# Patient Record
Sex: Female | Born: 2000 | Race: White | Hispanic: No | Smoking: Never smoker
Health system: Southern US, Community
[De-identification: ages and names within clinical notes are randomized; demographics above are authoritative.]

## PROBLEM LIST (undated history)

## (undated) DIAGNOSIS — H6690 Otitis media, unspecified, unspecified ear: Secondary | ICD-10-CM

## (undated) DIAGNOSIS — E282 Polycystic ovarian syndrome: Secondary | ICD-10-CM

## (undated) DIAGNOSIS — F32A Depression, unspecified: Secondary | ICD-10-CM

## (undated) DIAGNOSIS — L659 Nonscarring hair loss, unspecified: Secondary | ICD-10-CM

## (undated) DIAGNOSIS — F329 Major depressive disorder, single episode, unspecified: Secondary | ICD-10-CM

## (undated) DIAGNOSIS — F909 Attention-deficit hyperactivity disorder, unspecified type: Secondary | ICD-10-CM

## (undated) HISTORY — DX: Major depressive disorder, single episode, unspecified: F32.9

## (undated) HISTORY — PX: OTHER SURGICAL HISTORY: SHX169

## (undated) HISTORY — DX: Polycystic ovarian syndrome: E28.2

## (undated) HISTORY — DX: Nonscarring hair loss, unspecified: L65.9

## (undated) HISTORY — DX: Attention-deficit hyperactivity disorder, unspecified type: F90.9

## (undated) HISTORY — DX: Otitis media, unspecified, unspecified ear: H66.90

## (undated) HISTORY — DX: Depression, unspecified: F32.A

## (undated) HISTORY — PX: TYMPANOSTOMY TUBE PLACEMENT: SHX32

---
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
## 2020-01-18 ENCOUNTER — Encounter: Payer: Self-pay | Admitting: Family Medicine

## 2020-01-24 ENCOUNTER — Encounter: Payer: Self-pay | Admitting: Family Medicine

## 2020-02-02 ENCOUNTER — Encounter: Payer: Self-pay | Admitting: Family Medicine

## 2020-02-02 ENCOUNTER — Telehealth (INDEPENDENT_AMBULATORY_CARE_PROVIDER_SITE_OTHER): Payer: Managed Care, Other (non HMO) | Admitting: Family Medicine

## 2020-02-02 DIAGNOSIS — F900 Attention-deficit hyperactivity disorder, predominantly inattentive type: Secondary | ICD-10-CM | POA: Diagnosis not present

## 2020-02-02 MED ORDER — AMPHETAMINE-DEXTROAMPHET ER 20 MG PO CP24: 20.0000 mg | ORAL_CAPSULE | Freq: Every day | ORAL | 0 refills | Status: DC

## 2020-02-02 NOTE — Progress Notes (Signed)
   Subjective:    Patient ID: Jackie Tran, female    DOB: 02/01/2001, 19 y.o.   MRN: [id number]  HPI Virtual Visit via Video Note  I connected with the patient on 02/02/20 at  1:15 PM EDT by a video enabled telemedicine application and verified that I am speaking with the correct person using two identifiers.  Location patient: home Location provider:work or home office Persons participating in the virtual visit: patient, provider  I discussed the limitations of evaluation and management by telemedicine and the availability of in person appointments. The patient expressed understanding and agreed to proceed.   HPI: Here asking to get back on Adderall. She took this for a few years in high school and it worked well for her. Now she will be going to college next month and she wants to try it again.    ROS: See pertinent positives and negatives per HPI.  Past Medical History:  Diagnosis Date  . ADHD (attention deficit hyperactivity disorder)   . Asthma   . Depression    sees Ellis Savage NP at [facility]   . Otitis media    frequent occurences as a chiild    Past Surgical History:  Procedure Laterality Date  . surgery to repiar a fractured left radial head     per Dr. Ollen Gross  . TYMPANOSTOMY TUBE PLACEMENT      Family History  Problem Relation Age of Onset  . Alcohol abuse Other   . Asthma Other   . Coronary artery disease Other   . Depression Other   . Diabetes Other   . Hypertension Other      Current Outpatient Medications:  .  busPIRone (BUSPAR) 10 MG tablet, TK 1 T PO QAM AND QPM, Disp: 180 tablet, Rfl: 3 .  drospirenone-ethinyl estradiol (YAZ) 3-0.02 MG tablet, TAKE 1 TABLET BY MOUTH DAILY, Disp: 84 tablet, Rfl: 0 .  sertraline (ZOLOFT) 100 MG tablet, TAKE 1 TABLET ONCE DAILY IN THE MORNING, Disp: 90 tablet, Rfl: 3  EXAM:  VITALS per patient if applicable:  GENERAL: alert, oriented, appears well and in no acute distress  HEENT:  atraumatic, conjunttiva clear, no obvious abnormalities on inspection of external nose and ears  NECK: normal movements of the head and neck  LUNGS: on inspection no signs of respiratory distress, breathing rate appears normal, no obvious gross SOB, gasping or wheezing  CV: no obvious cyanosis  MS: moves all visible extremities without noticeable abnormality  PSYCH/NEURO: pleasant and cooperative, no obvious depression or anxiety, speech and thought processing grossly intact  ASSESSMENT AND PLAN: ADHD, we wrote to restart Adderall XR 20 mg daily.  Gershon Crane, MD  Discussed the following assessment and plan:  No diagnosis found.     I discussed the assessment and treatment plan with the patient. The patient was provided an opportunity to ask questions and all were answered. The patient agreed with the plan and demonstrated an understanding of the instructions.   The patient was advised to call back or seek an in-person evaluation if the symptoms worsen or if the condition fails to improve as anticipated.     Review of Systems     Objective:   Physical Exam        Assessment & Plan:

## 2020-02-17 ENCOUNTER — Other Ambulatory Visit: Payer: Self-pay | Admitting: Family Medicine

## 2020-03-26 ENCOUNTER — Encounter: Payer: Self-pay | Admitting: Family Medicine

## 2020-03-28 MED ORDER — AMPHETAMINE-DEXTROAMPHET ER 20 MG PO CP24: 20.0000 mg | ORAL_CAPSULE | Freq: Every day | ORAL | 0 refills | Status: DC

## 2020-03-28 NOTE — Telephone Encounter (Signed)
rx on file at the pharmacy for 04/04/2020 [facility] to change pharmacy?

## 2020-03-28 NOTE — Telephone Encounter (Signed)
I sent them to the new pharmacy  °

## 2020-04-20 ENCOUNTER — Encounter: Payer: Self-pay | Admitting: Family Medicine

## 2020-04-21 MED ORDER — SERTRALINE HCL 100 MG PO TABS
ORAL_TABLET | ORAL | 3 refills | Status: DC
Start: 2020-04-21 — End: 2021-05-31

## 2020-04-21 NOTE — Addendum Note (Signed)
Addended by: Gershon Crane A on: 04/21/2020 04:42 PM   Modules accepted: Orders

## 2020-04-21 NOTE — Telephone Encounter (Signed)
I sent the corrected rx to her pharmacy

## 2020-04-21 NOTE — Telephone Encounter (Signed)
Patient called and advised that in her notes and medication profile it only has notations to take Sertraline 1 tab, asked when did she start taking 1.5 tabs. She says about 1.5 years ago when she was seeing the NP at the psychiatrist office, that it must not have transferred over to Dr. Clent Ridges. She says she has enough medication to last until Tuesday. I advised I will send the request to Dr. Clent Ridges for approval and the medication will be sent to [facility].

## 2020-09-29 ENCOUNTER — Encounter: Payer: Self-pay | Admitting: Family Medicine

## 2020-09-29 ENCOUNTER — Other Ambulatory Visit: Payer: Self-pay | Admitting: Family Medicine

## 2020-10-02 ENCOUNTER — Other Ambulatory Visit: Payer: Self-pay

## 2020-10-02 MED ORDER — BUSPIRONE HCL 10 MG PO TABS: ORAL_TABLET | ORAL | 0 refills | Status: DC

## 2021-01-23 ENCOUNTER — Ambulatory Visit: Payer: Managed Care, Other (non HMO)

## 2021-02-23 ENCOUNTER — Other Ambulatory Visit: Payer: Self-pay | Admitting: Family Medicine

## 2021-05-09 ENCOUNTER — Other Ambulatory Visit: Payer: Self-pay | Admitting: Family Medicine

## 2021-05-09 NOTE — Telephone Encounter (Signed)
Left a message for pt to call the office and schedule f/u OV visit / Video visit or her adderall refill

## 2021-05-16 ENCOUNTER — Other Ambulatory Visit: Payer: Self-pay | Admitting: Family Medicine

## 2021-05-16 NOTE — Telephone Encounter (Signed)
Pt has a MyChart Visit with Dr Clent Ridges on 05/22/2021 for Rx fefill

## 2021-05-22 ENCOUNTER — Telehealth: Payer: Self-pay | Admitting: Family Medicine

## 2021-05-29 ENCOUNTER — Telehealth: Payer: Self-pay | Admitting: Family Medicine

## 2021-05-31 ENCOUNTER — Telehealth (INDEPENDENT_AMBULATORY_CARE_PROVIDER_SITE_OTHER): Payer: Self-pay | Admitting: Family Medicine

## 2021-05-31 ENCOUNTER — Encounter: Payer: Self-pay | Admitting: Family Medicine

## 2021-05-31 DIAGNOSIS — F418 Other specified anxiety disorders: Secondary | ICD-10-CM

## 2021-05-31 DIAGNOSIS — F900 Attention-deficit hyperactivity disorder, predominantly inattentive type: Secondary | ICD-10-CM

## 2021-05-31 MED ORDER — AMPHETAMINE-DEXTROAMPHET ER 20 MG PO CP24: ORAL_CAPSULE | ORAL | 0 refills | Status: DC

## 2021-05-31 MED ORDER — SERTRALINE HCL 100 MG PO TABS: ORAL_TABLET | ORAL | 3 refills | Status: DC

## 2021-05-31 MED ORDER — BUSPIRONE HCL 10 MG PO TABS: 10.0000 mg | ORAL_TABLET | Freq: Two times a day (BID) | ORAL | 3 refills | Status: DC

## 2021-05-31 MED ORDER — LEVONORGEST-ETH ESTRAD 91-DAY 0.15-0.03 &0.01 MG PO TABS: 1.0000 | ORAL_TABLET | Freq: Every day | ORAL | 3 refills | Status: DC

## 2021-05-31 NOTE — Progress Notes (Signed)
Subjective:    Patient ID: Jackie Tran, female    DOB: 01-12-2001, 20 y.o.   MRN: [id number]  HPI Virtual Visit via Video Note  I connected with the patient on 05/31/21 at  1:45 PM EST by a video enabled telemedicine application and verified that I am speaking with the correct person using two identifiers.  Location patient: home Location provider:work or home office Persons participating in the virtual visit: patient, provider  I discussed the limitations of evaluation and management by telemedicine and the availability of in person appointments. The patient expressed understanding and agreed to proceed.   HPI: Here to follow up on ADHD and depression with anxiety. She is a Holiday representative at [employer], and she is doing well as far as her moods go. However her Adderall seems to wear off too soon and she often struggles with her focus in the afternoons. Her BP and weight have been stable.   ROS: See pertinent positives and negatives per HPI.  Past Medical History:  Diagnosis Date   ADHD (attention deficit hyperactivity disorder)    Asthma    Depression    sees Ellis Savage NP at [facility]    Otitis media    frequent occurences as a chiild    Past Surgical History:  Procedure Laterality Date   surgery to repiar a fractured left radial head     per Dr. Homero Fellers Aluisio   TYMPANOSTOMY TUBE PLACEMENT      Family History  Problem Relation Age of Onset   Alcohol abuse Other    Asthma Other    Coronary artery disease Other    Depression Other    Diabetes Other    Hypertension Other      Current Outpatient Medications:    Levonorgestrel-Ethinyl Estradiol (SIMPESSE) 0.15-0.03 &0.01 MG tablet, Take 1 tablet by mouth daily., Disp: 91 tablet, Rfl: 3   [START ON 07/31/2021] amphetamine-dextroamphetamine (ADDERALL XR) 20 MG 24 hr capsule, Take one in the morning and one in the afternoon, Disp: 60 capsule, Rfl: 0   busPIRone (BUSPAR) 10 MG tablet, Take 1  tablet (10 mg total) by mouth 2 (two) times daily. TK 1 T PO QAM AND QPM, Disp: 180 tablet, Rfl: 3   sertraline (ZOLOFT) 100 MG tablet, Take 1.5 tablets every day, Disp: 135 tablet, Rfl: 3  EXAM:  VITALS per patient if applicable:  GENERAL: alert, oriented, appears well and in no acute distress  HEENT: atraumatic, conjunttiva clear, no obvious abnormalities on inspection of external nose and ears  NECK: normal movements of the head and neck  LUNGS: on inspection no signs of respiratory distress, breathing rate appears normal, no obvious gross SOB, gasping or wheezing  CV: no obvious cyanosis  MS: moves all visible extremities without noticeable abnormality  PSYCH/NEURO: pleasant and cooperative, no obvious depression or anxiety, speech and thought processing grossly intact  ASSESSMENT AND PLAN: Her depression with anxiety is stable so we refilled the Sertraline and Buspirone as before. For the ADHD, we will  increase the Adderall XR 20 mg to one in the mornings and another dose in the early afternoons. Gershon Crane, MD  Discussed the following assessment and plan:  Attention deficit hyperactivity disorder (ADHD), predominantly inattentive type  Depression with anxiety     I discussed the assessment and treatment plan with the patient. The patient was provided an opportunity to ask questions and all were answered. The patient agreed with the plan and demonstrated an understanding of the instructions.  The patient was advised to call back or seek an in-person evaluation if the symptoms worsen or if the condition fails to improve as anticipated.      Review of Systems     Objective:   Physical Exam        Assessment & Plan:

## 2021-07-31 ENCOUNTER — Ambulatory Visit (INDEPENDENT_AMBULATORY_CARE_PROVIDER_SITE_OTHER): Payer: No Typology Code available for payment source | Admitting: Family Medicine

## 2021-07-31 ENCOUNTER — Encounter: Payer: Self-pay | Admitting: Family Medicine

## 2021-07-31 VITALS — BP 128/94 | HR 113 | Temp 98.5°F | Ht 63.0 in | Wt 236.4 lb

## 2021-07-31 DIAGNOSIS — F3281 Premenstrual dysphoric disorder: Secondary | ICD-10-CM | POA: Diagnosis not present

## 2021-07-31 DIAGNOSIS — F418 Other specified anxiety disorders: Secondary | ICD-10-CM

## 2021-07-31 DIAGNOSIS — F900 Attention-deficit hyperactivity disorder, predominantly inattentive type: Secondary | ICD-10-CM

## 2021-07-31 DIAGNOSIS — J3089 Other allergic rhinitis: Secondary | ICD-10-CM | POA: Insufficient documentation

## 2021-07-31 DIAGNOSIS — R739 Hyperglycemia, unspecified: Secondary | ICD-10-CM

## 2021-07-31 MED ORDER — VENLAFAXINE HCL ER 150 MG PO CP24: 150.0000 mg | ORAL_CAPSULE | Freq: Every day | ORAL | 3 refills | Status: DC

## 2021-07-31 NOTE — Progress Notes (Signed)
° °  Subjective:    Patient ID: Jackie Tran, female    DOB: [birth year], 21 y.o.   MRN: [id number]  HPI Here for several issues. She is here on winter break from her classes at [employer]. She has been dealing with more anxiety than ever before, and often this is tied her menstrual cycles. 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 She changed her BCP from a 28 day cycle to a 90 day cycle and she takes this continuously. She has been taking Buspar and Zoloft from [place] for several years. He sleeps well. Her appetite is good. Also she is concerned that she might have a weak immune system because she gets sick frequently. Over the past 5 months she has had 5 upper respiratory infections.    Review of Systems  Constitutional: Negative.   Respiratory: Negative.    Cardiovascular: Negative.   Psychiatric/Behavioral:  Negative for agitation, behavioral problems, confusion, decreased concentration, dysphoric mood, hallucinations, self-injury, sleep disturbance and suicidal ideas. The patient is nervous/anxious.       Objective:   Physical Exam Constitutional:      Appearance: Normal appearance.  Cardiovascular:     Rate and Rhythm: Normal rate and regular rhythm.     Pulses: Normal pulses.     Heart sounds: Normal heart sounds.  Pulmonary:     Effort: Pulmonary effort is normal.     Breath sounds: Normal breath sounds.  Neurological:     Mental Status: She is alert.  Psychiatric:        Mood and Affect: Mood normal.        Behavior: Behavior normal.        Thought Content: Thought content normal.          Assessment & Plan:  She will follow up with her GYN for PMDD. For he anxiety, she will stop both the Zoloft and the Buspar, and she will start Effexor XR 150 mg daily. I asked her to report back in 3-4 weeks. Because of her frequent URI's I asked her to use allergy medications every day. She will start taking Claritin and  Flonase daily. While she is here today we will get screening lab work to rule out other potential problems. We spent a total of ( 33  ) minutes reviewing records and discussing these issues.  Gershon Crane, MD

## 2021-08-01 LAB — CBC WITH DIFFERENTIAL/PLATELET
Basophils Absolute: 0.1 10*3/uL (ref 0.0–0.1)
Basophils Relative: 0.7 % (ref 0.0–3.0)
Eosinophils Absolute: 0.2 10*3/uL (ref 0.0–0.7)
Eosinophils Relative: 1.7 % (ref 0.0–5.0)
HCT: 42 % (ref 36.0–46.0)
Hemoglobin: 13.6 g/dL (ref 12.0–15.0)
Lymphocytes Relative: 23.9 % (ref 12.0–46.0)
Lymphs Abs: 2.5 10*3/uL (ref 0.7–4.0)
MCHC: 32.3 g/dL (ref 30.0–36.0)
MCV: 84.8 fl (ref 78.0–100.0)
Monocytes Absolute: 0.6 10*3/uL (ref 0.1–1.0)
Monocytes Relative: 6.1 % (ref 3.0–12.0)
Neutro Abs: 7.2 10*3/uL (ref 1.4–7.7)
Neutrophils Relative %: 67.6 % (ref 43.0–77.0)
Platelets: 322 10*3/uL (ref 150.0–400.0)
RBC: 4.95 Mil/uL (ref 3.87–5.11)
RDW: 14.1 % (ref 11.5–15.5)
WBC: 10.7 10*3/uL — ABNORMAL HIGH (ref 4.0–10.5)

## 2021-08-01 LAB — BASIC METABOLIC PANEL
BUN: 10 mg/dL (ref 6–23)
CO2: 24 mEq/L (ref 19–32)
Calcium: 9.1 mg/dL (ref 8.4–10.5)
Chloride: 105 mEq/L (ref 96–112)
Creatinine, Ser: 0.75 mg/dL (ref 0.40–1.20)
GFR: 114.13 mL/min (ref >60.00)
Glucose, Bld: 81 mg/dL (ref 70–99)
Potassium: 3.8 mEq/L (ref 3.5–5.1)
Sodium: 137 mEq/L (ref 135–145)

## 2021-08-01 LAB — HEPATIC FUNCTION PANEL
ALT: 36 U/L — ABNORMAL HIGH (ref 0–35)
AST: 25 U/L (ref 0–37)
Albumin: 4.2 g/dL (ref 3.5–5.2)
Alkaline Phosphatase: 84 U/L (ref 39–117)
Bilirubin, Direct: 0.1 mg/dL (ref 0.0–0.3)
Total Bilirubin: 0.3 mg/dL (ref 0.2–1.2)
Total Protein: 7.4 g/dL (ref 6.0–8.3)

## 2021-08-01 LAB — T4, FREE: Free T4: 1.07 ng/dL (ref 0.60–1.60)

## 2021-08-01 LAB — T3, FREE: T3, Free: 4.2 pg/mL (ref 2.3–4.2)

## 2021-08-01 LAB — TSH: TSH: 1.95 u[IU]/mL (ref 0.35–5.50)

## 2021-08-01 LAB — HEMOGLOBIN A1C: Hgb A1c MFr Bld: 5.5 % (ref 4.6–6.5)

## 2021-09-24 ENCOUNTER — Encounter: Payer: Self-pay | Admitting: Family Medicine

## 2021-09-24 NOTE — Telephone Encounter (Signed)
Please set up a virtual OV as soon as possible (maybe tomorrow?)  ?

## 2021-09-26 ENCOUNTER — Encounter: Payer: Self-pay | Admitting: Family Medicine

## 2021-09-26 ENCOUNTER — Telehealth (INDEPENDENT_AMBULATORY_CARE_PROVIDER_SITE_OTHER): Payer: No Typology Code available for payment source | Admitting: Family Medicine

## 2021-09-26 DIAGNOSIS — F418 Other specified anxiety disorders: Secondary | ICD-10-CM | POA: Diagnosis not present

## 2021-09-26 DIAGNOSIS — F900 Attention-deficit hyperactivity disorder, predominantly inattentive type: Secondary | ICD-10-CM

## 2021-09-26 DIAGNOSIS — J3089 Other allergic rhinitis: Secondary | ICD-10-CM | POA: Diagnosis not present

## 2021-09-26 MED ORDER — CLONAZEPAM 0.5 MG PO TABS: 0.5000 mg | ORAL_TABLET | Freq: Two times a day (BID) | ORAL | 5 refills | Status: DC | PRN

## 2021-09-26 MED ORDER — VENLAFAXINE HCL ER 150 MG PO CP24: 300.0000 mg | ORAL_CAPSULE | Freq: Every day | ORAL | 5 refills | Status: DC

## 2021-09-26 NOTE — Progress Notes (Signed)
? ?Subjective:  ? ? Patient ID: Jackie Tran, female    DOB: [birth year], 21 y.o.   MRN: [id number] ? ?HPI ?Virtual Visit via Video Note ? ?I connected with the patient on 09/26/21 at  2:00 PM EST by a video enabled telemedicine application and verified that I am speaking with the correct person using two identifiers. ? Location patient: home ?Location provider:work or home office ?Persons participating in the virtual visit: patient, provider ? ?I discussed the limitations of evaluation and management by telemedicine and the availability of in person appointments. The patient expressed understanding and agreed to proceed. ? ? ?HPI: ?Here to follow up on anxiety and allergies. She says her classes in [place] are going well. She has been taking Effexor XR 150 mg daily and she tolerates this well, however she doesn't think it is helping much. This past weekend she says the anxiety flared up and she was miserable for several days. She notes that she took Clonazepam for a few years while in high school, and this was very effective for her. At our last visit we also discussed her tendency to get frequent colds and URI's, and we felt her allergies may make her more susceptible to these. She has been using Xyzal and Flonase every day, but this has not helped much.  ? ? ?ROS: See pertinent positives and negatives per HPI. ? ?Past Medical History:  ?Diagnosis Date  ? ADHD (attention deficit hyperactivity disorder)   ? Asthma   ? Depression   ? sees Noemi Chapel NP at [facility]   ? Otitis media   ? frequent occurences as a chiild  ? ? ?Past Surgical History:  ?Procedure Laterality Date  ? surgery to repiar a fractured left radial head    ? per Dr. Gaynelle Arabian  ? TYMPANOSTOMY TUBE PLACEMENT    ? ? ?Family History  ?Problem Relation Age of Onset  ? Alcohol abuse Other   ? Asthma Other   ? Coronary artery disease Other   ? Depression Other   ? Diabetes Other   ? Hypertension Other   ? ? ? ?Current Outpatient Medications:   ?  amphetamine-dextroamphetamine (ADDERALL XR) 20 MG 24 hr capsule, Take one in the morning and one in the afternoon, Disp: 60 capsule, Rfl: 0 ?  Levonorgestrel-Ethinyl Estradiol (SIMPESSE) 0.15-0.03 &0.01 MG tablet, Take 1 tablet by mouth daily., Disp: 91 tablet, Rfl: 3 ?  venlafaxine XR (EFFEXOR XR) 150 MG 24 hr capsule, Take 1 capsule (150 mg total) by mouth daily with breakfast., Disp: 90 capsule, Rfl: 3 ? ?EXAM: ? ?VITALS per patient if applicable: ? ?GENERAL: alert, oriented, appears well and in no acute distress ? ?HEENT: atraumatic, conjunttiva clear, no obvious abnormalities on inspection of external nose and ears ? ?NECK: normal movements of the head and neck ? ?LUNGS: on inspection no signs of respiratory distress, breathing rate appears normal, no obvious gross SOB, gasping or wheezing ? ?CV: no obvious cyanosis ? ?MS: moves all visible extremities without noticeable abnormality ? ?PSYCH/NEURO: pleasant and cooperative, no obvious depression or anxiety, speech and thought processing grossly intact ? ?ASSESSMENT AND PLAN: ?For the anxiety, we will increase the Efffexor XR to 300 mg daily, and we will add Clonazepam 0.5 mg to take as needed for breakthrough anxiety. For the allergies, we will refer her to [facility] to do allergy testing when she comes home this summer.  ?Alysia Penna, MD ? ?Discussed the following assessment and plan: ? ?No  diagnosis found. ? ? ?  ?I discussed the assessment and treatment plan with the patient. The patient was provided an opportunity to ask questions and all were answered. The patient agreed with the plan and demonstrated an understanding of the instructions. ?  ?The patient was advised to call back or seek an in-person evaluation if the symptoms worsen or if the condition fails to improve as anticipated. ? ?  ? ? ?Review of Systems ? ?   ?Objective:  ? Physical Exam ? ? ? ? ?   ?Assessment & Plan:  ? ? ?

## 2021-10-04 ENCOUNTER — Telehealth: Payer: No Typology Code available for payment source | Admitting: Family Medicine

## 2021-11-13 ENCOUNTER — Encounter: Payer: Self-pay | Admitting: Family Medicine

## 2021-11-13 ENCOUNTER — Telehealth (INDEPENDENT_AMBULATORY_CARE_PROVIDER_SITE_OTHER): Payer: No Typology Code available for payment source | Admitting: Family Medicine

## 2021-11-13 DIAGNOSIS — R030 Elevated blood-pressure reading, without diagnosis of hypertension: Secondary | ICD-10-CM | POA: Diagnosis not present

## 2021-11-13 MED ORDER — METOPROLOL SUCCINATE ER 25 MG PO TB24: 25.0000 mg | ORAL_TABLET | Freq: Every day | ORAL | 3 refills | Status: DC

## 2021-11-13 NOTE — Progress Notes (Signed)
? ?Subjective:  ? ? Patient ID: Jackie Tran, female    DOB: 2001/02/07, 21 y.o.   MRN: [id number] ? ?HPI ?Virtual Visit via Video Note ? ?I connected with the patient on 11/13/21 at  4:00 PM EDT by a video enabled telemedicine application and verified that I am speaking with the correct person using two identifiers. ? Location patient: home ?Location provider:work or home office ?Persons participating in the virtual visit: patient, provider ? ?I discussed the limitations of evaluation and management by telemedicine and the availability of in person appointments. The patient expressed understanding and agreed to proceed. ? ? ?HPI: ?Here for concerns about high BP. The last time she was seen here was in January and her BP was 128/94 and pulse was 113. Since then she has been told at several doctor offices or urgent care clinics that her BP is high. In fact when she saw her GYN for a routine exam last week, her BP was 162/100. Her GYN changed her BCP that day to a progesterone only pill in case the estrogen was causing her BP to go up. Since then Shephanie has been checking herself daily in her dorm room. Her BP has ranged from 142-163 systolic and from 100 -105 diastolic. Her pulse rate has averaged from 86 -106. She says she has not taken any Adderall since January because she reserves this for around exams. She feels fine, and she denies any headaches or dizziness or SOB. Her weight has been stable. Of note she has a strong family hx of HTN including her mother.  ? ? ?ROS: See pertinent positives and negatives per HPI. ? ?Past Medical History:  ?Diagnosis Date  ? ADHD (attention deficit hyperactivity disorder)   ? Asthma   ? Depression   ? sees Ellis Savage NP at [facility]   ? Otitis media   ? frequent occurences as a chiild  ? ? ?Past Surgical History:  ?Procedure Laterality Date  ? surgery to repiar a fractured left radial head    ? per Dr. Ollen Gross  ? TYMPANOSTOMY TUBE PLACEMENT    ? ? ?Family  History  ?Problem Relation Age of Onset  ? Alcohol abuse Other   ? Asthma Other   ? Coronary artery disease Other   ? Depression Other   ? Diabetes Other   ? Hypertension Other   ? ? ? ?Current Outpatient Medications:  ?  amphetamine-dextroamphetamine (ADDERALL XR) 20 MG 24 hr capsule, Take one in the morning and one in the afternoon, Disp: 60 capsule, Rfl: 0 ?  clonazePAM (KLONOPIN) 0.5 MG tablet, Take 1 tablet (0.5 mg total) by mouth 2 (two) times daily as needed for anxiety., Disp: 60 tablet, Rfl: 5 ?  metoprolol succinate (TOPROL-XL) 25 MG 24 hr tablet, Take 1 tablet (25 mg total) by mouth daily., Disp: 30 tablet, Rfl: 3 ?  norethindrone (MICRONOR) 0.35 MG tablet, Take 1 tablet by mouth daily., Disp: , Rfl:  ?  venlafaxine XR (EFFEXOR-XR) 150 MG 24 hr capsule, Take 2 capsules (300 mg total) by mouth daily with breakfast., Disp: 60 capsule, Rfl: 5 ? ?EXAM: ? ?VITALS per patient if applicable: ? ?GENERAL: alert, oriented, appears well and in no acute distress ? ?HEENT: atraumatic, conjunttiva clear, no obvious abnormalities on inspection of external nose and ears ? ?NECK: normal movements of the head and neck ? ?LUNGS: on inspection no signs of respiratory distress, breathing rate appears normal, no obvious gross SOB, gasping or wheezing ? ?CV: no  obvious cyanosis ? ?MS: moves all visible extremities without noticeable abnormality ? ?PSYCH/NEURO: pleasant and cooperative, no obvious depression or anxiety, speech and thought processing grossly intact ? ?ASSESSMENT AND PLAN: ?She seems to have developed some HTN, so we will begin treating this with Metoprolol succinate 25 mg daily. She will be in [place] in the middle of May, and I asked her to set up an in person OV with me when she gets here. We will check labs then including  thyroid levels.  ?Gershon Crane, MD ? ?Discussed the following assessment and plan: ? ?No diagnosis found. ? ? ?  ?I discussed the assessment and treatment plan with the patient. The  patient was provided an opportunity to ask questions and all were answered. The patient agreed with the plan and demonstrated an understanding of the instructions. ?  ?The patient was advised to call back or seek an in-person evaluation if the symptoms worsen or if the condition fails to improve as anticipated. ? ?  ? ? ?Review of Systems ? ?   ?Objective:  ? Physical Exam ? ? ? ? ?   ?Assessment & Plan:  ? ? ?

## 2021-11-23 ENCOUNTER — Encounter: Payer: Self-pay | Admitting: Family Medicine

## 2021-11-23 NOTE — Progress Notes (Unsigned)
Let's try 50 mg daily. Tell her to take two 25 mg tablets a day for one week and then report back  ?

## 2021-12-03 ENCOUNTER — Encounter: Payer: Self-pay | Admitting: Family Medicine

## 2021-12-03 ENCOUNTER — Ambulatory Visit (INDEPENDENT_AMBULATORY_CARE_PROVIDER_SITE_OTHER): Payer: No Typology Code available for payment source | Admitting: Family Medicine

## 2021-12-03 VITALS — BP 122/80 | HR 68 | Temp 98.6°F | Wt 222.0 lb

## 2021-12-03 DIAGNOSIS — I10 Essential (primary) hypertension: Secondary | ICD-10-CM

## 2021-12-03 MED ORDER — METOPROLOL SUCCINATE ER 25 MG PO TB24: 25.0000 mg | ORAL_TABLET | Freq: Every day | ORAL | 3 refills | Status: DC

## 2021-12-03 NOTE — Progress Notes (Signed)
? ?  Subjective:  ? ? Patient ID: GIONNA ECKE, female    DOB: [birth year], 21 y.o.   MRN: [id number] ? ?HPI ?Here to follow up on HTN. When we had a video visit with her on 11-13-21 her BP was running as high as 162/100 with a rapid pulse rate. Since starting on Metoprolol succinate she has felt better, and her BP has been well controlled. She is out of class now for the summer. She plans to work at [employer] for 3 months.  ? ? ?Review of Systems  ?Constitutional: Negative.   ?Respiratory:  Positive for apnea.   ?Cardiovascular:  Negative for chest pain.  ? ?   ?Objective:  ? Physical Exam ?Constitutional:   ?   General: She is not in acute distress. ?   Appearance: She is obese.  ?Cardiovascular:  ?   Rate and Rhythm: Normal rate and regular rhythm.  ?   Pulses: Normal pulses.  ?Pulmonary:  ?   Effort: Pulmonary effort is normal.  ?   Breath sounds: Normal breath sounds.  ?Musculoskeletal:  ?   Right lower leg: No edema.  ?   Left lower leg: No edema.  ?Neurological:  ?   Mental Status: She is alert.  ? ? ? ? ? ?   ?Assessment & Plan:  ?HTN, now well controlled. She wil stay on the current regimen.  ?Alysia Penna, MD ? ? ?

## 2021-12-05 ENCOUNTER — Encounter: Payer: Self-pay | Admitting: Family Medicine

## 2021-12-07 NOTE — Telephone Encounter (Signed)
Patient called to follow up on Metoprolol being corrected to 50mg  from 25mg . I let patient know that PCP was out of office but I would send it back.       Please advise

## 2021-12-10 ENCOUNTER — Other Ambulatory Visit: Payer: Self-pay

## 2021-12-10 DIAGNOSIS — I10 Essential (primary) hypertension: Secondary | ICD-10-CM

## 2021-12-10 MED ORDER — METOPROLOL SUCCINATE ER 50 MG PO TB24: 50.0000 mg | ORAL_TABLET | Freq: Every day | ORAL | 3 refills | Status: DC

## 2021-12-10 NOTE — Telephone Encounter (Signed)
I agree. Please increase the Metoprolol succinate to 50 mg daily. Call in a one year supply

## 2021-12-17 NOTE — Progress Notes (Unsigned)
New Patient Note  RE: Jackie Tran MRN: [id number] DOB: [birth year] Date of Office Visit: 12/18/2021  Consult requested by: Nelwyn Salisbury, MD Primary care provider: Nelwyn Salisbury, MD  Chief Complaint: No chief complaint on file.  History of Present Illness: I had the pleasure of seeing Jackie Tran for initial evaluation at the [facility] on 12/17/2021. She is a 21 y.o. female, who is referred here by Nelwyn Salisbury, MD for the evaluation of environmental allergies.  She reports symptoms of ***. Symptoms have been going on for *** years. The symptoms are present *** all year around with worsening in ***. Other triggers include exposure to ***. Anosmia: ***. Headache: ***. She has used *** with ***fair improvement in symptoms. Sinus infections: ***. Previous work up includes: ***. Previous ENT evaluation: ***. Previous sinus imaging: ***. History of nasal polyps: ***. Last eye exam: ***. History of reflux: ***.  Assessment and Plan: Jackie Tran is a 21 y.o. female with: No problem-specific Assessment & Plan notes found for this encounter.  No follow-ups on file.  No orders of the defined types were placed in this encounter.  Lab Orders  No laboratory test(s) ordered today    Other allergy screening: Asthma: {Blank single:19197::"yes","no"} Rhino conjunctivitis: {Blank single:19197::"yes","no"} Food allergy: {Blank single:19197::"yes","no"} Medication allergy: {Blank single:19197::"yes","no"} Hymenoptera allergy: {Blank single:19197::"yes","no"} Urticaria: {Blank single:19197::"yes","no"} Eczema:{Blank single:19197::"yes","no"} History of recurrent infections suggestive of immunodeficency: {Blank single:19197::"yes","no"}  Diagnostics: Spirometry:  Tracings reviewed. Her effort: {Blank single:19197::"Good reproducible efforts.","It was hard to get consistent efforts and there is a question as to whether this reflects a maximal maneuver.","Poor  effort, data can not be interpreted."} FVC: ***L FEV1: ***L, ***% predicted FEV1/FVC ratio: ***% Interpretation: {Blank single:19197::"Spirometry consistent with mild obstructive disease","Spirometry consistent with moderate obstructive disease","Spirometry consistent with severe obstructive disease","Spirometry consistent with possible restrictive disease","Spirometry consistent with mixed obstructive and restrictive disease","Spirometry uninterpretable due to technique","Spirometry consistent with normal pattern","No overt abnormalities noted given today's efforts"}.  Please see scanned spirometry results for details.  Skin Testing: {Blank single:19197::"Select foods","Environmental allergy panel","Environmental allergy panel and select foods","Food allergy panel","None","Deferred due to recent antihistamines use"}. *** Results discussed with patient/family.   Past Medical History: Patient Active Problem List   Diagnosis Date Noted  . HTN (hypertension) 12/03/2021  . PMDD (premenstrual dysphoric disorder) 07/31/2021  . Environmental and seasonal allergies 07/31/2021  . Depression with anxiety 08/06/2019  . Dysmenorrhea 06/17/2018  . ADHD (attention deficit hyperactivity disorder) 10/29/2011   Past Medical History:  Diagnosis Date  . ADHD (attention deficit hyperactivity disorder)   . Asthma   . Depression    sees Jackie Savage NP at [facility]   . Otitis media    frequent occurences as a chiild   Past Surgical History: Past Surgical History:  Procedure Laterality Date  . surgery to repiar a fractured left radial head     per Dr. Ollen Gross  . TYMPANOSTOMY TUBE PLACEMENT     Medication List:  Current Outpatient Medications  Medication Sig Dispense Refill  . metoprolol succinate (TOPROL-XL) 50 MG 24 hr tablet Take 1 tablet (50 mg total) by mouth daily. Take with or immediately following a meal. 90 tablet 3  . amphetamine-dextroamphetamine (ADDERALL XR) 20 MG 24 hr  capsule Take one in the morning and one in the afternoon (Patient taking differently: as needed. Take one in the morning and one in the afternoon) 60 capsule 0  . clonazePAM (KLONOPIN) 0.5 MG tablet Take 1 tablet (0.5 mg total)  by mouth 2 (two) times daily as needed for anxiety. 60 tablet 5  . norethindrone (MICRONOR) 0.35 MG tablet Take 1 tablet by mouth daily.    Marland Kitchen venlafaxine XR (EFFEXOR-XR) 150 MG 24 hr capsule Take 2 capsules (300 mg total) by mouth daily with breakfast. 60 capsule 5   No current facility-administered medications for this visit.   Allergies: Allergies  Allergen Reactions  . Penicillins     REACTION: hives   Social History: Social History   Socioeconomic History  . Marital status: Single    Spouse name: Not on file  . Number of children: Not on file  . Years of education: Not on file  . Highest education level: Not on file  Occupational History  . Not on file  Tobacco Use  . Smoking status: Never  . Smokeless tobacco: Never  Substance and Sexual Activity  . Alcohol use: No    Alcohol/week: 0.0 standard drinks  . Drug use: No  . Sexual activity: Not on file  Other Topics Concern  . Not on file  Social History Narrative  . Not on file   Social Determinants of Health   Financial Resource Strain: Low Risk   . Difficulty of Paying Living Expenses: Not hard at all  Food Insecurity: No Food Insecurity  . Worried About Charity fundraiser in the Last Year: Never true  . Ran Out of Food in the Last Year: Never true  Transportation Needs: No Transportation Needs  . Lack of Transportation (Medical): No  . Lack of Transportation (Non-Medical): No  Physical Activity: Unknown  . Days of Exercise per Week: Patient refused  . Minutes of Exercise per Session: Not on file  Stress: Stress Concern Present  . Feeling of Stress : To some extent  Social Connections: Unknown  . Frequency of Communication with Friends and Family: Patient refused  . Frequency of  Social Gatherings with Friends and Family: Patient refused  . Attends Religious Services: Never  . Active Member of Clubs or Organizations: Patient refused  . Attends Archivist Meetings: Not on file  . Marital Status: Never married   Lives in a ***. Smoking: *** Occupation: ***  Environmental HistoryFreight forwarder in the house: Estate agent in the family room: {Blank single:19197::"yes","no"} Carpet in the bedroom: {Blank single:19197::"yes","no"} Heating: {Blank single:19197::"electric","gas","heat pump"} Cooling: {Blank single:19197::"central","window","heat pump"} Pet: {Blank single:19197::"yes ***","no"}  Family History: Family History  Problem Relation Age of Onset  . Alcohol abuse Other   . Asthma Other   . Coronary artery disease Other   . Depression Other   . Diabetes Other   . Hypertension Other    Problem                               Relation Asthma                                   *** Eczema                                *** Food allergy                          *** Allergic rhino conjunctivitis     ***  Review of Systems  Constitutional:  Negative for appetite change, chills, fever and unexpected weight change.  HENT:  Negative for congestion and rhinorrhea.   Eyes:  Negative for itching.  Respiratory:  Negative for cough, chest tightness, shortness of breath and wheezing.   Cardiovascular:  Negative for chest pain.  Gastrointestinal:  Negative for abdominal pain.  Genitourinary:  Negative for difficulty urinating.  Skin:  Negative for rash.  Neurological:  Negative for headaches.   Objective: There were no vitals taken for this visit. There is no height or weight on file to calculate BMI. Physical Exam Vitals and nursing note reviewed.  Constitutional:      Appearance: Normal appearance. She is well-developed.  HENT:     Head: Normocephalic and atraumatic.     Right Ear: Tympanic membrane and external  ear normal.     Left Ear: Tympanic membrane and external ear normal.     Nose: Nose normal.     Mouth/Throat:     Mouth: Mucous membranes are moist.     Pharynx: Oropharynx is clear.  Eyes:     Conjunctiva/sclera: Conjunctivae normal.  Cardiovascular:     Rate and Rhythm: Normal rate and regular rhythm.     Heart sounds: Normal heart sounds. No murmur heard.   No friction rub. No gallop.  Pulmonary:     Effort: Pulmonary effort is normal.     Breath sounds: Normal breath sounds. No wheezing, rhonchi or rales.  Musculoskeletal:     Cervical back: Neck supple.  Skin:    General: Skin is warm.     Findings: No rash.  Neurological:     Mental Status: She is alert and oriented to person, place, and time.  Psychiatric:        Behavior: Behavior normal.  The plan was reviewed with the patient/family, and all questions/concerned were addressed.  It was my pleasure to see Jackie Tran today and participate in her care. Please feel free to contact me with any questions or concerns.  Sincerely,  Rexene Alberts, DO Allergy & Immunology  [facility] office: [place] office: [phone]

## 2021-12-18 ENCOUNTER — Ambulatory Visit (INDEPENDENT_AMBULATORY_CARE_PROVIDER_SITE_OTHER): Payer: No Typology Code available for payment source | Admitting: Allergy

## 2021-12-18 ENCOUNTER — Encounter: Payer: Self-pay | Admitting: Allergy

## 2021-12-18 ENCOUNTER — Other Ambulatory Visit: Payer: Self-pay

## 2021-12-18 VITALS — BP 122/80 | HR 93 | Temp 98.7°F | Resp 18 | Ht 63.39 in | Wt 221.0 lb

## 2021-12-18 DIAGNOSIS — J3089 Other allergic rhinitis: Secondary | ICD-10-CM | POA: Diagnosis not present

## 2021-12-18 MED ORDER — RYALTRIS 665-25 MCG/ACT NA SUSP: 1.0000 | Freq: Two times a day (BID) | NASAL | 5 refills | Status: DC

## 2021-12-18 NOTE — Assessment & Plan Note (Signed)
Rhinitis symptoms for a few years mainly in the fall and spring.  Tried Xyzal and Flonase with minimal benefit.  No prior allergy/ENT evaluation.  Today's skin testing showed: Positive to grass, ragweed, weed, trees, dust mites, cockroach.  Start environmental control measures as below.  Use over the counter antihistamines such as Zyrtec (cetirizine), Claritin (loratadine), Allegra (fexofenadine), or Xyzal (levocetirizine) daily as needed. May take twice a day during allergy flares. May switch antihistamines every few months.  Start Ryaltris (olopatadine + mometasone nasal spray combination) 1-2 sprays per nostril twice a day. Sample given.  This will be mailed to you.  Stop Flonase.   Nasal saline spray (i.e., Simply Saline) or nasal saline lavage (i.e., NeilMed) is recommended as needed and prior to medicated nasal sprays.  Consider allergy injections for long term control if above medications do not help the symptoms - handout given.

## 2021-12-18 NOTE — Patient Instructions (Addendum)
Today's skin testing showed: Positive to grass, ragweed, weed, trees, dust mites, cockroach.  Results given.  Environmental allergies Start environmental control measures as below. Use over the counter antihistamines such as Zyrtec (cetirizine), Claritin (loratadine), Allegra (fexofenadine), or Xyzal (levocetirizine) daily as needed. May take twice a day during allergy flares. May switch antihistamines every few months. Start Ryaltris (olopatadine + mometasone nasal spray combination) 1-2 sprays per nostril twice a day. Sample given. This will be mailed to you. Stop Flonase.  Nasal saline spray (i.e., Simply Saline) or nasal saline lavage (i.e., NeilMed) is recommended as needed and prior to medicated nasal sprays. Consider allergy injections for long term control if above medications do not help the symptoms - handout given.   Follow up in 3 months or sooner if needed.    Reducing Pollen Exposure Pollen seasons: trees (spring), grass (summer) and ragweed/weeds (fall). Keep windows closed in your home and car to lower pollen exposure.  Install air conditioning in the bedroom and throughout the house if possible.  Avoid going out in dry windy days - especially early morning. Pollen counts are highest between 5 - 10 AM and on dry, hot and windy days.  Save outside activities for late afternoon or after a heavy rain, when pollen levels are lower.  Avoid mowing of grass if you have grass pollen allergy. Be aware that pollen can also be transported indoors on people and pets.  Dry your clothes in an automatic dryer rather than hanging them outside where they might collect pollen.  Rinse hair and eyes before bedtime. Control of House Dust Mite Allergen Dust mite allergens are a common trigger of allergy and asthma symptoms. While they can be found throughout the house, these microscopic creatures thrive in warm, humid environments such as bedding, upholstered furniture and carpeting. Because  so much time is spent in the bedroom, it is essential to reduce mite levels there.  Encase pillows, mattresses, and box springs in special allergen-proof fabric covers or airtight, zippered plastic covers.  Bedding should be washed weekly in hot water (130 F) and dried in a hot dryer. Allergen-proof covers are available for comforters and pillows that can't be regularly washed.  Wash the allergy-proof covers every few months. Minimize clutter in the bedroom. Keep pets out of the bedroom.  Keep humidity less than 50% by using a dehumidifier or air conditioning. You can buy a humidity measuring device called a hygrometer to monitor this.  If possible, replace carpets with hardwood, linoleum, or washable area rugs. If that's not possible, vacuum frequently with a vacuum that has a HEPA filter. Remove all upholstered furniture and non-washable window drapes from the bedroom. Remove all non-washable stuffed toys from the bedroom.  Wash stuffed toys weekly. Cockroach Allergen Avoidance Cockroaches are often found in the homes of densely populated urban areas, schools or commercial buildings, but these creatures can lurk almost anywhere. This does not mean that you have a dirty house or living area. Block all areas where roaches can enter the home. This includes crevices, wall cracks and windows.  Cockroaches need water to survive, so fix and seal all leaky faucets and pipes. Have an exterminator go through the house when your family and pets are gone to eliminate any remaining roaches. Keep food in lidded containers and put pet food dishes away after your pets are done eating. Vacuum and sweep the floor after meals, and take out garbage and recyclables. Use lidded garbage containers in the kitchen. Wash dishes immediately after  use and clean under stoves, refrigerators or toasters where crumbs can accumulate. Wipe off the stove and other kitchen surfaces and cupboards regularly.

## 2022-02-26 ENCOUNTER — Ambulatory Visit (INDEPENDENT_AMBULATORY_CARE_PROVIDER_SITE_OTHER): Payer: No Typology Code available for payment source | Admitting: Family Medicine

## 2022-02-26 ENCOUNTER — Encounter: Payer: Self-pay | Admitting: Family Medicine

## 2022-02-26 VITALS — BP 124/78 | HR 63 | Temp 98.7°F | Wt 219.0 lb

## 2022-02-26 DIAGNOSIS — F900 Attention-deficit hyperactivity disorder, predominantly inattentive type: Secondary | ICD-10-CM | POA: Diagnosis not present

## 2022-02-26 DIAGNOSIS — E0590 Thyrotoxicosis, unspecified without thyrotoxic crisis or storm: Secondary | ICD-10-CM | POA: Diagnosis not present

## 2022-02-26 DIAGNOSIS — R61 Generalized hyperhidrosis: Secondary | ICD-10-CM

## 2022-02-26 DIAGNOSIS — I10 Essential (primary) hypertension: Secondary | ICD-10-CM | POA: Diagnosis not present

## 2022-02-26 MED ORDER — PROPRANOLOL HCL ER 60 MG PO CP24
60.0000 mg | ORAL_CAPSULE | Freq: Every day | ORAL | 2 refills | Status: DC
Start: 2022-02-26 — End: 2022-05-24

## 2022-02-26 NOTE — Progress Notes (Signed)
   Subjective:    Patient ID: Jackie Tran, female    DOB: [birth year], 21 y.o.   MRN: [id number]  HPI Here to discuss excessive sweating which began about one year ago. She says this has become more of a problem in the past 6 months. This mostly involves the scalp, face, and back. This can be so bad that her hair becomes soaking wet. She has not been on any new medications in the last year. She has been diagnosed by her GYN with PCOS, and she is treated with Norethindrone and Spironolactone. She had a well exam with labs in January and her free T3 was at the upper limit of normal at 4.2, while the free t4 and TSH were well within normal ranges. She has also lost about 17 pounds of weight since January, and she is not trying to do so.    Review of Systems  Constitutional:  Positive for unexpected weight change.  Respiratory: Negative.    Cardiovascular: Negative.   Gastrointestinal: Negative.   Endocrine: Positive for heat intolerance.  Genitourinary: Negative.   Neurological: Negative.        Objective:   Physical Exam Constitutional:      General: She is not in acute distress.    Appearance: Normal appearance.  Cardiovascular:     Rate and Rhythm: Normal rate and regular rhythm.     Pulses: Normal pulses.     Heart sounds: Normal heart sounds.  Pulmonary:     Effort: Pulmonary effort is normal.     Breath sounds: Normal breath sounds.  Neurological:     General: No focal deficit present.     Mental Status: She is alert and oriented to person, place, and time.           Assessment & Plan:  She has excessive sweating and unintended weight loss, so we have to consider hyperthyroidism as a possible etiology. We will check another thyroid panel today. We will stop the Metoprolol and switch to Propranolol XR 60 mg daily. Recheck in 4 weeks. Gershon Crane, MD

## 2022-02-27 LAB — T4, FREE: Free T4: 0.78 ng/dL (ref 0.60–1.60)

## 2022-02-27 LAB — TSH: TSH: 0.74 u[IU]/mL (ref 0.35–5.50)

## 2022-02-27 LAB — T3, FREE: T3, Free: 3.3 pg/mL (ref 2.3–4.2)

## 2022-03-19 ENCOUNTER — Other Ambulatory Visit: Payer: Self-pay | Admitting: Family Medicine

## 2022-05-24 ENCOUNTER — Other Ambulatory Visit: Payer: Self-pay | Admitting: Family Medicine

## 2022-07-24 ENCOUNTER — Ambulatory Visit: Payer: No Typology Code available for payment source | Admitting: Family Medicine

## 2022-08-21 ENCOUNTER — Other Ambulatory Visit: Payer: Self-pay | Admitting: Family Medicine

## 2022-12-06 ENCOUNTER — Other Ambulatory Visit: Payer: Self-pay | Admitting: Family Medicine

## 2023-03-21 ENCOUNTER — Other Ambulatory Visit: Payer: Self-pay | Admitting: Family Medicine

## 2023-04-30 ENCOUNTER — Encounter: Payer: Self-pay | Admitting: Family Medicine

## 2023-05-01 NOTE — Telephone Encounter (Signed)
Yes I could prescribe this but she will need to have a pelvic exam and Pap smear every year. If she is due, please set this up. I am wondering though if she has moved back to [place], why send the RX to [facility]?

## 2023-07-13 ENCOUNTER — Other Ambulatory Visit: Payer: Self-pay | Admitting: Family Medicine

## 2023-09-17 ENCOUNTER — Ambulatory Visit (INDEPENDENT_AMBULATORY_CARE_PROVIDER_SITE_OTHER): Payer: Managed Care, Other (non HMO) | Admitting: Family Medicine

## 2023-09-17 VITALS — BP 120/78 | HR 86 | Temp 98.1°F | Ht 62.75 in | Wt 206.0 lb

## 2023-09-17 DIAGNOSIS — Z0000 Encounter for general adult medical examination without abnormal findings: Secondary | ICD-10-CM | POA: Diagnosis not present

## 2023-09-17 DIAGNOSIS — Z23 Encounter for immunization: Secondary | ICD-10-CM | POA: Diagnosis not present

## 2023-09-17 DIAGNOSIS — L659 Nonscarring hair loss, unspecified: Secondary | ICD-10-CM | POA: Insufficient documentation

## 2023-09-17 MED ORDER — PROPRANOLOL HCL ER 60 MG PO CP24: 60.0000 mg | ORAL_CAPSULE | Freq: Every day | ORAL | 3 refills | Status: AC

## 2023-09-17 NOTE — Addendum Note (Signed)
 Addended by: Carola Rhine on: 09/17/2023 05:20 PM   Modules accepted: Orders

## 2023-09-17 NOTE — Progress Notes (Signed)
 Subjective:    Patient ID: Jackie Tran, female    DOB: 2000/10/05, 23 y.o.   MRN: [id number]  HPI Here for a well exam. She feels fine and has no concerns. She had been seeing a GYN while she was away at college, but she does not have one here. She asks of we can assume care of her GYN needs instead. 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    Review of Systems  Constitutional: Negative.   HENT: Negative.    Eyes: Negative.   Respiratory: Negative.    Cardiovascular: Negative.   Gastrointestinal: Negative.   Genitourinary:  Negative for decreased urine volume, difficulty urinating, dyspareunia, dysuria, enuresis, flank pain, frequency, hematuria, pelvic pain and urgency.  Musculoskeletal: Negative.   Skin: Negative.   Neurological: Negative.  Negative for headaches.  Psychiatric/Behavioral: Negative.         Objective:   Physical Exam Constitutional:      General: She is not in acute distress.    Appearance: She is well-developed. She is obese.  HENT:     Head: Normocephalic and atraumatic.     Right Ear: External ear normal.     Left Ear: External ear normal.     Nose: Nose normal.     Mouth/Throat:     Pharynx: No oropharyngeal exudate.  Eyes:     General: No scleral icterus.    Conjunctiva/sclera: Conjunctivae normal.     Pupils: Pupils are equal, round, and reactive to light.  Neck:     Thyroid: No thyromegaly.     Vascular: No JVD.  Cardiovascular:     Rate and Rhythm: Normal rate and regular rhythm.     Pulses: Normal pulses.     Heart sounds: Normal heart sounds. No murmur heard.    No friction rub. No gallop.  Pulmonary:     Effort: Pulmonary effort is normal. No respiratory distress.     Breath sounds: Normal breath sounds. No wheezing or rales.  Chest:     Chest wall: No tenderness.  Abdominal:     General: Bowel sounds are normal. There is no distension.     Palpations: Abdomen is soft. There is no mass.      Tenderness: There is no abdominal tenderness. There is no guarding or rebound.  Musculoskeletal:        General: No tenderness. Normal range of motion.     Cervical back: Normal range of motion and neck supple.  Lymphadenopathy:     Cervical: No cervical adenopathy.  Skin:    General: Skin is warm and dry.     Findings: No erythema or rash.  Neurological:     General: No focal deficit present.     Mental Status: She is alert and oriented to person, place, and time.     Cranial Nerves: No cranial nerve deficit.     Motor: No abnormal muscle tone.     Coordination: Coordination normal.     Deep Tendon Reflexes: Reflexes are normal and symmetric. Reflexes normal.  Psychiatric:        Mood and Affect: Mood normal.        Behavior: Behavior normal.        Thought Content: Thought content normal.        Judgment: Judgment normal.           Assessment & Plan:  Well exam. We discussed diet and exercise. Get fasting labs. She  will return soon for a pelvic exam and Pap smear.  Gershon Crane, MD

## 2023-09-22 ENCOUNTER — Other Ambulatory Visit (INDEPENDENT_AMBULATORY_CARE_PROVIDER_SITE_OTHER): Payer: Managed Care, Other (non HMO)

## 2023-09-22 DIAGNOSIS — Z131 Encounter for screening for diabetes mellitus: Secondary | ICD-10-CM

## 2023-09-22 DIAGNOSIS — Z0000 Encounter for general adult medical examination without abnormal findings: Secondary | ICD-10-CM | POA: Diagnosis not present

## 2023-09-22 LAB — CBC WITH DIFFERENTIAL/PLATELET
Basophils Absolute: 0 10*3/uL (ref 0.0–0.1)
Basophils Relative: 0.5 % (ref 0.0–3.0)
Eosinophils Absolute: 0.2 10*3/uL (ref 0.0–0.7)
Eosinophils Relative: 2.8 % (ref 0.0–5.0)
HCT: 43.1 % (ref 36.0–46.0)
Hemoglobin: 14.2 g/dL (ref 12.0–15.0)
Lymphocytes Relative: 40.4 % (ref 12.0–46.0)
Lymphs Abs: 2.9 10*3/uL (ref 0.7–4.0)
MCHC: 32.9 g/dL (ref 30.0–36.0)
MCV: 89.7 fl (ref 78.0–100.0)
Monocytes Absolute: 0.5 10*3/uL (ref 0.1–1.0)
Monocytes Relative: 7.3 % (ref 3.0–12.0)
Neutro Abs: 3.6 10*3/uL (ref 1.4–7.7)
Neutrophils Relative %: 49 % (ref 43.0–77.0)
Platelets: 286 10*3/uL (ref 150.0–400.0)
RBC: 4.8 Mil/uL (ref 3.87–5.11)
RDW: 13.1 % (ref 11.5–15.5)
WBC: 7.3 10*3/uL (ref 4.0–10.5)

## 2023-09-22 LAB — LIPID PANEL
Cholesterol: 150 mg/dL (ref 0–200)
HDL: 57.9 mg/dL (ref >39.00)
LDL Cholesterol: 70 mg/dL (ref 0–99)
NonHDL: 91.76
Total CHOL/HDL Ratio: 3
Triglycerides: 109 mg/dL (ref 0.0–149.0)
VLDL: 21.8 mg/dL (ref 0.0–40.0)

## 2023-09-22 LAB — BASIC METABOLIC PANEL
BUN: 11 mg/dL (ref 6–23)
CO2: 25 meq/L (ref 19–32)
Calcium: 9.5 mg/dL (ref 8.4–10.5)
Chloride: 106 meq/L (ref 96–112)
Creatinine, Ser: 0.77 mg/dL (ref 0.40–1.20)
GFR: 108.93 mL/min (ref >60.00)
Glucose, Bld: 87 mg/dL (ref 70–99)
Potassium: 4.3 meq/L (ref 3.5–5.1)
Sodium: 140 meq/L (ref 135–145)

## 2023-09-22 LAB — HEPATIC FUNCTION PANEL
ALT: 33 U/L (ref 0–35)
AST: 23 U/L (ref 0–37)
Albumin: 4.1 g/dL (ref 3.5–5.2)
Alkaline Phosphatase: 61 U/L (ref 39–117)
Bilirubin, Direct: 0.1 mg/dL (ref 0.0–0.3)
Total Bilirubin: 0.5 mg/dL (ref 0.2–1.2)
Total Protein: 7.1 g/dL (ref 6.0–8.3)

## 2023-09-22 LAB — HEMOGLOBIN A1C: Hgb A1c MFr Bld: 5.2 % (ref 4.6–6.5)

## 2023-09-22 LAB — TSH: TSH: 1.11 u[IU]/mL (ref 0.35–5.50)

## 2023-09-24 ENCOUNTER — Encounter: Payer: Self-pay | Admitting: *Deleted

## 2023-10-06 ENCOUNTER — Telehealth: Admitting: Family Medicine

## 2023-10-07 ENCOUNTER — Ambulatory Visit: Payer: Self-pay | Admitting: Family Medicine

## 2023-10-07 ENCOUNTER — Encounter: Payer: Self-pay | Admitting: Adult Health

## 2023-10-07 ENCOUNTER — Telehealth: Admitting: Adult Health

## 2023-10-07 VITALS — Wt 207.0 lb

## 2023-10-07 DIAGNOSIS — J0140 Acute pansinusitis, unspecified: Secondary | ICD-10-CM

## 2023-10-07 MED ORDER — DOXYCYCLINE HYCLATE 100 MG PO CAPS: 100.0000 mg | ORAL_CAPSULE | Freq: Two times a day (BID) | ORAL | 0 refills | Status: DC

## 2023-10-07 NOTE — Progress Notes (Signed)
 Virtual Visit via Video Note  I connected with [place] on 10/07/23 at  2:00 PM EDT by a video enabled telemedicine application and verified that I am speaking with the correct person using two identifiers.  Location patient: home Location provider:work or home office Persons participating in the virtual visit: patient, provider  I discussed the limitations of evaluation and management by telemedicine and the availability of in person appointments. The patient expressed understanding and agreed to proceed.   HPI: She has been evaluated today for an acute issue.  Her symptoms started 7 days ago and have started to get progressively worse.  Symptoms include nasal congestion, rhinorrhea, sinus pain and pressure, headache, cough and sore throat.  She has not had any fevers or chills.  Does not have trouble swallowing food or liquid.  She has pressure above her eyes and below her eyes.  She has had green nasal drainage.  Counter medications have not been effective   ROS: See pertinent positives and negatives per HPI.  Past Medical History:  Diagnosis Date   ADHD (attention deficit hyperactivity disorder)    Alopecia    sees Dr. Anda Kraft   Asthma    Depression    sees a psychiatrist in [place]   Otitis media    frequent occurences as a chiild    Past Surgical History:  Procedure Laterality Date   surgery to repiar a fractured left radial head     per Dr. Homero Fellers Aluisio   TYMPANOSTOMY TUBE PLACEMENT      Family History  Problem Relation Age of Onset   Alcohol abuse Other    Asthma Other    Coronary artery disease Other    Depression Other    Diabetes Other    Hypertension Other        Current Outpatient Medications:    doxycycline (VIBRAMYCIN) 100 MG capsule, Take 1 capsule (100 mg total) by mouth 2 (two) times daily., Disp: 14 capsule, Rfl: 0   amphetamine-dextroamphetamine (ADDERALL XR) 20 MG 24 hr capsule, Take one in the morning and one in the afternoon  (Patient taking differently: as needed. Take one in the morning and one in the afternoon), Disp: 60 capsule, Rfl: 0   clonazePAM (KLONOPIN) 0.5 MG tablet, Take 1 tablet (0.5 mg total) by mouth 2 (two) times daily as needed for anxiety., Disp: 60 tablet, Rfl: 5   drospirenone-ethinyl estradiol (YASMIN) 3-0.03 MG tablet, Take 1 tablet by mouth daily., Disp: , Rfl:    Minoxidil 5 % FOAM, Apply topically daily at 12 noon., Disp: , Rfl:    propranolol ER (INDERAL LA) 60 MG 24 hr capsule, Take 1 capsule (60 mg total) by mouth daily., Disp: 90 capsule, Rfl: 3   spironolactone (ALDACTONE) 100 MG tablet, , Disp: , Rfl:   EXAM:  VITALS per patient if applicable:  GENERAL: alert, oriented, appears well and in no acute distress  HEENT: atraumatic, conjunttiva clear, no obvious abnormalities on inspection of external nose and ears  NECK: normal movements of the head and neck  LUNGS: on inspection no signs of respiratory distress, breathing rate appears normal, no obvious gross SOB, gasping or wheezing  CV: no obvious cyanosis  MS: moves all visible extremities without noticeable abnormality  PSYCH/NEURO: pleasant and cooperative, no obvious depression or anxiety, speech and thought processing grossly intact  ASSESSMENT AND PLAN:  Discussed the following assessment and plan:  1. Acute non-recurrent pansinusitis (Primary) - Will treat for acute sinusitis do to duration and symptoms.  Advised follow up if not improving/resolved by the end of abx treatment or sooner if symptoms worsen - doxycycline (VIBRAMYCIN) 100 MG capsule; Take 1 capsule (100 mg total) by mouth 2 (two) times daily.  Dispense: 14 capsule; Refill: 0     I discussed the assessment and treatment plan with the patient. The patient was provided an opportunity to ask questions and all were answered. The patient agreed with the plan and demonstrated an understanding of the instructions.   The patient was advised to call back or seek  an in-person evaluation if the symptoms worsen or if the condition fails to improve as anticipated.   Shirline Frees, NP

## 2023-10-07 NOTE — Telephone Encounter (Signed)
 Copied from CRM [id number]. Topic: Clinical - Red Word Triage >> Oct 07, 2023 11:57 AM Theodis Sato wrote: Red Word that prompted transfer to Nurse Triage: Green mucus and sinus pressure    Chief Complaint: Sinus pain, pressure, green mucus.Cough, sore throat. Symptoms: Above Frequency: 1 week Pertinent Negatives: Patient denies fever Disposition: [] ED /[] Urgent Care (no appt availability in office) / [x] Appointment(In office/virtual)/ []  [facility]/ [] Home Care/ [] Refused Recommended Disposition /[] [facility]/ []  Follow-up with PCP Additional Notes: Pt. Agrees with appointment.  Reason for Disposition  Fever present > 3 days (72 hours)  Answer Assessment - Initial Assessment Questions 1. LOCATION: "Where does it hurt?"      Headache 2. ONSET: "When did the sinus pain start?"  (e.g., hours, days)      1 week 3. SEVERITY: "How bad is the pain?"   (Scale 1-10; mild, moderate or severe)   - MILD (1-3): doesn't interfere with normal activities    - MODERATE (4-7): interferes with normal activities (e.g., work or school) or awakens from sleep   - SEVERE (8-10): excruciating pain and patient unable to do any normal activities        5-6 4. RECURRENT SYMPTOM: "Have you ever had sinus problems before?" If Yes, ask: "When was the last time?" and "What happened that time?"      Yes 5. NASAL CONGESTION: "Is the nose blocked?" If Yes, ask: "Can you open it or must you breathe through your mouth?"     No 6. NASAL DISCHARGE: "Do you have discharge from your nose?" If so ask, "What color?"     Green 7. FEVER: "Do you have a fever?" If Yes, ask: "What is it, how was it measured, and when did it start?"      No 8. OTHER SYMPTOMS: "Do you have any other symptoms?" (e.g., sore throat, cough, earache, difficulty breathing)     Cough, sore throat 9. PREGNANCY: "Is there any chance you are pregnant?" "When was your last menstrual period?"     No  Protocols used: Sinus Pain or  Congestion-A-AH

## 2023-10-10 NOTE — Telephone Encounter (Signed)
 Noted.

## 2024-01-08 ENCOUNTER — Ambulatory Visit: Admitting: Family Medicine

## 2024-01-08 VITALS — BP 118/80 | HR 66 | Temp 98.9°F | Wt 213.0 lb

## 2024-01-08 DIAGNOSIS — N83202 Unspecified ovarian cyst, left side: Secondary | ICD-10-CM | POA: Diagnosis not present

## 2024-01-08 NOTE — Progress Notes (Signed)
   Subjective:    Patient ID: Jackie Tran, female    DOB: 2001/03/26, 23 y.o.   MRN: [id number]  HPI Here asking for a referral to GYN. She has been dealing with intermittent left lower abdominal pains for the past 2 years. She saw a GYN in [place] last November, and an US  revealed a left ovarian cyst. She has been taking continuous daily BCP, but these have not helped. Her LMP was 12-09-23. She is home from college this summer, and she wants to see someone here in [place].    Review of Systems  Constitutional: Negative.   Respiratory: Negative.    Cardiovascular: Negative.   Gastrointestinal:  Positive for abdominal pain. Negative for blood in stool, constipation, diarrhea, nausea and vomiting.  Genitourinary: Negative.        Objective:   Physical Exam Constitutional:      Appearance: Normal appearance.   Cardiovascular:     Rate and Rhythm: Normal rate and regular rhythm.     Pulses: Normal pulses.     Heart sounds: Normal heart sounds.  Pulmonary:     Effort: Pulmonary effort is normal.     Breath sounds: Normal breath sounds.   Neurological:     Mental Status: She is alert.           Assessment & Plan:  Left ovarian cyst pain, we will refer her to GYN.  Corita Diego, MD

## 2024-01-20 ENCOUNTER — Ambulatory Visit: Admitting: Family Medicine

## 2024-01-20 ENCOUNTER — Encounter: Payer: Self-pay | Admitting: Family Medicine

## 2024-01-20 VITALS — BP 118/80 | HR 67 | Temp 98.4°F | Wt 212.0 lb

## 2024-01-20 DIAGNOSIS — R300 Dysuria: Secondary | ICD-10-CM | POA: Diagnosis not present

## 2024-01-20 DIAGNOSIS — N390 Urinary tract infection, site not specified: Secondary | ICD-10-CM

## 2024-01-20 LAB — POC URINALSYSI DIPSTICK (AUTOMATED)
Bilirubin, UA: NEGATIVE
Blood, UA: NEGATIVE
Glucose, UA: NEGATIVE
Ketones, UA: NEGATIVE
Nitrite, UA: POSITIVE
Protein, UA: POSITIVE — AB
Spec Grav, UA: 1.025 (ref 1.010–1.025)
Urobilinogen, UA: 0.2 U/dL
pH, UA: 6 (ref 5.0–8.0)

## 2024-01-20 MED ORDER — CIPROFLOXACIN HCL 500 MG PO TABS: 500.0000 mg | ORAL_TABLET | Freq: Two times a day (BID) | ORAL | 0 refills | Status: DC

## 2024-01-20 NOTE — Progress Notes (Signed)
   Subjective:    Patient ID: Jackie Tran, female    DOB: 02-27-2001, 23 y.o.   MRN: [id number]  HPI Here for 2 weeks of burning with urination and some urgency. No nausea or back pain or fever. She drinks plenty of water.    Review of Systems  Constitutional: Negative.   Respiratory: Negative.    Cardiovascular: Negative.   Genitourinary:  Positive for dysuria, frequency and urgency. Negative for flank pain and hematuria.       Objective:   Physical Exam Constitutional:      Appearance: Normal appearance.   Cardiovascular:     Rate and Rhythm: Normal rate and regular rhythm.     Pulses: Normal pulses.     Heart sounds: Normal heart sounds.  Pulmonary:     Effort: Pulmonary effort is normal.     Breath sounds: Normal breath sounds.  Abdominal:     Tenderness: There is no right CVA tenderness or left CVA tenderness.   Neurological:     Mental Status: She is alert.           Assessment & Plan:  UTI, treat with 7 days of Cipro. Culture the sample.  Garnette Olmsted, MD

## 2024-01-22 ENCOUNTER — Ambulatory Visit: Payer: Self-pay | Admitting: Family Medicine

## 2024-01-22 LAB — URINE CULTURE
MICRO NUMBER:: 16646434
SPECIMEN QUALITY:: ADEQUATE

## 2024-02-06 ENCOUNTER — Encounter: Payer: Self-pay | Admitting: Radiology

## 2024-02-06 ENCOUNTER — Ambulatory Visit: Admitting: Radiology

## 2024-02-06 VITALS — BP 110/70 | HR 72 | Ht 63.58 in | Wt 208.8 lb

## 2024-02-06 DIAGNOSIS — R1032 Left lower quadrant pain: Secondary | ICD-10-CM

## 2024-02-06 NOTE — Progress Notes (Signed)
   Jackie Tran 2001/07/21 [id number]   History:  23 y.o. G0 presents as a new patient. Referred by PCP. Hx of PCOS, on OCPs. C/o LLQ pain x 2 years. Hx of ovarian cyst. Last u/s done in [facility] 11/23. Pain is not with her period, states it happens at random times.   Gynecologic History Patient's last menstrual period was 01/26/2024 (within days). Period Cycle (Days): 28 Period Duration (Days): 5 Period Pattern: Regular Contraception/Family planning: OCP (estrogen/progesterone) Sexually active: yes Last Pap: 4/23. Results were: normal  Obstetric History OB History  Gravida Para Term Preterm AB Living  0 0 0 0 0 0  SAB IAB Ectopic Multiple Live Births  0 0 0 0 0      The following portions of the patient's history were reviewed and updated as appropriate: allergies, current medications, past family history, past medical history, past social history, past surgical history, and problem list.  ROS  Past medical history, past surgical history, family history and social history were all reviewed and documented in the EPIC chart.  Exam:  Vitals:   02/06/24 0910  BP: 110/70  Pulse: 72  SpO2: 98%  Weight: 208 lb 12.8 oz (94.7 kg)  Height: 5' 3.58 (1.615 m)   Body mass index is 36.31 kg/m.  Physical Exam Vitals and nursing note reviewed. Exam conducted with a chaperone present.  Constitutional:      Appearance: Normal appearance.  Pulmonary:     Effort: Pulmonary effort is normal.  Abdominal:     General: Abdomen is flat. Bowel sounds are normal.     Palpations: Abdomen is soft.  Genitourinary:    General: Normal vulva.     Vagina: Normal.     Cervix: Normal.     Uterus: Normal.      Adnexa: Right adnexa normal and left adnexa normal.  Neurological:     Mental Status: She is alert.  Psychiatric:        Mood and Affect: Mood normal.        Thought Content: Thought content normal.        Judgment: Judgment normal.     Darice Hoit, CMA present for  exam  Assessment/Plan:   1. LLQ pain (Primary) - US  PELVIS TRANSVAGINAL NON-OB (TV ONLY); Future  Will contact with results of u/s and manage accordingly  Return for u/s only will conact with results.  GINETTE COZIER B WHNP-BC 9:40 AM 02/06/2024

## 2024-02-10 NOTE — Telephone Encounter (Signed)
 That's fine. Will call with results.

## 2024-02-11 ENCOUNTER — Other Ambulatory Visit: Payer: Self-pay

## 2024-02-18 ENCOUNTER — Ambulatory Visit: Payer: Self-pay | Admitting: Radiology

## 2024-02-18 ENCOUNTER — Encounter: Payer: Self-pay | Admitting: Radiology

## 2024-02-18 ENCOUNTER — Ambulatory Visit: Admitting: Radiology

## 2024-02-18 ENCOUNTER — Other Ambulatory Visit

## 2024-02-18 ENCOUNTER — Other Ambulatory Visit: Admitting: Radiology

## 2024-04-16 ENCOUNTER — Telehealth: Admitting: Physician Assistant

## 2024-04-16 ENCOUNTER — Ambulatory Visit: Admitting: Family Medicine

## 2024-04-16 DIAGNOSIS — J019 Acute sinusitis, unspecified: Secondary | ICD-10-CM

## 2024-04-16 DIAGNOSIS — B9689 Other specified bacterial agents as the cause of diseases classified elsewhere: Secondary | ICD-10-CM

## 2024-04-16 MED ORDER — DOXYCYCLINE HYCLATE 100 MG PO TABS: 100.0000 mg | ORAL_TABLET | Freq: Two times a day (BID) | ORAL | 0 refills | Status: AC

## 2024-04-16 NOTE — Patient Instructions (Signed)
 Aleck FORBES Primer, thank you for joining Delon CHRISTELLA Dickinson, PA-C for today's virtual visit.  While this provider is not your primary care provider (PCP), if your PCP is located in our provider database this encounter information will be shared with them immediately following your visit.   A [facility] MyChart account gives you access to today's visit and all your visits, tests, and labs performed at [facility]  click here if you don't have a [facility] MyChart account or go to [email or web address]  Consent: (Patient) Jackie Tran provided verbal consent for this virtual visit at the beginning of the encounter.  Current Medications:  Current Outpatient Medications:    doxycycline  (VIBRA -TABS) 100 MG tablet, Take 1 tablet (100 mg total) by mouth 2 (two) times daily., Disp: 14 tablet, Rfl: 0   buPROPion (WELLBUTRIN XL) 300 MG 24 hr tablet, , Disp: , Rfl:    drospirenone -ethinyl estradiol  (YAZ) 3-0.02 MG tablet, YAZ, Disp: , Rfl:    fexofenadine (ALLEGRA ODT) 30 MG disintegrating tablet, Take 30 mg by mouth daily., Disp: , Rfl:    Minoxidil 5 % FOAM, Apply topically daily at 12 noon., Disp: , Rfl:    propranolol  ER (INDERAL  LA) 60 MG 24 hr capsule, Take 1 capsule (60 mg total) by mouth daily., Disp: 90 capsule, Rfl: 3   spironolactone (ALDACTONE) 100 MG tablet, , Disp: , Rfl:    venlafaxine  (EFFEXOR ) 25 MG tablet, Venlafaxine  HCl, Disp: , Rfl:    Medications ordered in this encounter:  Meds ordered this encounter  Medications   doxycycline  (VIBRA -TABS) 100 MG tablet    Sig: Take 1 tablet (100 mg total) by mouth 2 (two) times daily.    Dispense:  14 tablet    Refill:  0    Supervising Provider:   BLAISE ALEENE KIDD [[id number]]     *If you need refills on other medications prior to your next appointment, please contact your pharmacy*  Follow-Up: Call back or seek an in-person evaluation if the symptoms worsen or if the condition fails to improve as  anticipated.  [facility] [phone]  Other Instructions  Sinus Infection, Adult A sinus infection, also called sinusitis, is inflammation of your sinuses. Sinuses are hollow spaces in the bones around your face. Your sinuses are located: Around your eyes. In the middle of your forehead. Behind your nose. In your cheekbones. Mucus normally drains out of your sinuses. When your nasal tissues become inflamed or swollen, mucus can become trapped or blocked. This allows bacteria, viruses, and fungi to grow, which leads to infection. Most infections of the sinuses are caused by a virus. A sinus infection can develop quickly. It can last for up to 4 weeks (acute) or for more than 12 weeks (chronic). A sinus infection often develops after a cold. What are the causes? This condition is caused by anything that creates swelling in the sinuses or stops mucus from draining. This includes: Allergies. Asthma. Infection from bacteria or viruses. Deformities or blockages in your nose or sinuses. Abnormal growths in the nose (nasal polyps). Pollutants, such as chemicals or irritants in the air. Infection from fungi. This is rare. What increases the risk? You are more likely to develop this condition if you: Have a weak body defense system (immune system). Do a lot of swimming or diving. Overuse nasal sprays. Smoke. What are the signs or symptoms? The main symptoms of this condition are pain and a feeling of pressure around the affected sinuses. Other symptoms  include: Stuffy nose or congestion that makes it difficult to breathe through your nose. Thick yellow or greenish drainage from your nose. Tenderness, swelling, and warmth over the affected sinuses. A cough that may get worse at night. Decreased sense of smell and taste. Extra mucus that collects in the throat or the back of the nose (postnasal drip) causing a sore throat or bad breath. Tiredness (fatigue). Fever. How is  this diagnosed? This condition is diagnosed based on: Your symptoms. Your medical history. A physical exam. Tests to find out if your condition is acute or chronic. This may include: Checking your nose for nasal polyps. Viewing your sinuses using a device that has a light (endoscope). Testing for allergies or bacteria. Imaging tests, such as an MRI or CT scan. In rare cases, a bone biopsy may be done to rule out more serious types of fungal sinus disease. How is this treated? Treatment for a sinus infection depends on the cause and whether your condition is chronic or acute. If caused by a virus, your symptoms should go away on their own within 10 days. You may be given medicines to relieve symptoms. They include: Medicines that shrink swollen nasal passages (decongestants). A spray that eases inflammation of the nostrils (topical intranasal corticosteroids). Rinses that help get rid of thick mucus in your nose (nasal saline washes). Medicines that treat allergies (antihistamines). Over-the-counter pain relievers. If caused by bacteria, your health care provider may recommend waiting to see if your symptoms improve. Most bacterial infections will get better without antibiotic medicine. You may be given antibiotics if you have: A severe infection. A weak immune system. If caused by narrow nasal passages or nasal polyps, surgery may be needed. Follow these instructions at home: Medicines Take, use, or apply over-the-counter and prescription medicines only as told by your health care provider. These may include nasal sprays. If you were prescribed an antibiotic medicine, take it as told by your health care provider. Do not stop taking the antibiotic even if you start to feel better. Hydrate and humidify  Drink enough fluid to keep your urine pale yellow. Staying hydrated will help to thin your mucus. Use a cool mist humidifier to keep the humidity level in your home above 50%. Inhale  steam for 10-15 minutes, 3-4 times a day, or as told by your health care provider. You can do this in the bathroom while a hot shower is running. Limit your exposure to cool or dry air. Rest Rest as much as possible. Sleep with your head raised (elevated). Make sure you get enough sleep each night. General instructions  Apply a warm, moist washcloth to your face 3-4 times a day or as told by your health care provider. This will help with discomfort. Use nasal saline washes as often as told by your health care provider. Wash your hands often with soap and water to reduce your exposure to germs. If soap and water are not available, use hand sanitizer. Do not smoke. Avoid being around people who are smoking (secondhand smoke). Keep all follow-up visits. This is important. Contact a health care provider if: You have a fever. Your symptoms get worse. Your symptoms do not improve within 10 days. Get help right away if: You have a severe headache. You have persistent vomiting. You have severe pain or swelling around your face or eyes. You have vision problems. You develop confusion. Your neck is stiff. You have trouble breathing. These symptoms may be an emergency. Get help right away.  Call 911. Do not wait to see if the symptoms will go away. Do not drive yourself to the hospital. Summary A sinus infection is soreness and inflammation of your sinuses. Sinuses are hollow spaces in the bones around your face. This condition is caused by nasal tissues that become inflamed or swollen. The swelling traps or blocks the flow of mucus. This allows bacteria, viruses, and fungi to grow, which leads to infection. If you were prescribed an antibiotic medicine, take it as told by your health care provider. Do not stop taking the antibiotic even if you start to feel better. Keep all follow-up visits. This is important. This information is not intended to replace advice given to you by your health care  provider. Make sure you discuss any questions you have with your health care provider. Document Revised: 06/12/2021 Document Reviewed: 06/12/2021 Elsevier Patient Education  2024 [employer]   If you have been instructed to have an in-person evaluation today at a local Urgent Care facility, please use the link below. It will take you to a list of all of our available [facility], including address, phone number and hours of operation. Please do not delay care.  [facility]  If you or a family member do not have a primary care provider, use the link below to schedule a visit and establish care. When you choose a [facility] primary care physician or advanced practice provider, you gain a long-term partner in health. Find a Primary Care Provider  Learn more about [facility]'s in-office and virtual care options: [facility] - Get Care Now

## 2024-04-16 NOTE — Progress Notes (Signed)
 Virtual Visit Consent   Jackie Tran, you are scheduled for a virtual visit with a [facility] provider today. Just as with appointments in the office, your consent must be obtained to participate. Your consent will be active for this visit and any virtual visit you may have with one of our providers in the next 365 days. If you have a MyChart account, a copy of this consent can be sent to you electronically.  As this is a virtual visit, video technology does not allow for your provider to perform a traditional examination. This may limit your provider's ability to fully assess your condition. If your provider identifies any concerns that need to be evaluated in person or the need to arrange testing (such as labs, EKG, etc.), we will make arrangements to do so. Although advances in technology are sophisticated, we cannot ensure that it will always work on either your end or our end. If the connection with a video visit is poor, the visit may have to be switched to a telephone visit. With either a video or telephone visit, we are not always able to ensure that we have a secure connection.  By engaging in this virtual visit, you consent to the provision of healthcare and authorize for your insurance to be billed (if applicable) for the services provided during this visit. Depending on your insurance coverage, you may receive a charge related to this service.  I need to obtain your verbal consent now. Are you willing to proceed with your visit today? Jackie Tran has provided verbal consent on 04/16/2024 for a virtual visit (video or telephone). Delon CHRISTELLA Dickinson, PA-C  Date: 04/16/2024 12:53 PM   Virtual Visit via Video Note   I, Delon CHRISTELLA Dickinson, connected with  Jackie Tran  ([id number], September 09, 2000) on 04/16/24 at 12:45 PM EDT by a video-enabled telemedicine application and verified that I am speaking with the correct person using two identifiers.  Location: Patient: Virtual Visit  Location Patient: Home Provider: Virtual Visit Location Provider: Home Office   I discussed the limitations of evaluation and management by telemedicine and the availability of in person appointments. The patient expressed understanding and agreed to proceed.    History of Present Illness: Jackie Tran is a 23 y.o. who identifies as a female who was assigned female at birth, and is being seen today for sinus congestion.  HPI: Sinusitis This is a new problem. The current episode started 1 to 4 weeks ago (Started over a week ago, improved yesterday then worsened this morning). The problem has been gradually worsening since onset. There has been no fever. The pain is moderate. Associated symptoms include congestion, coughing, ear pain (mild), headaches, shortness of breath (mild with exertion), sinus pressure and a sore throat (still mild). Pertinent negatives include no chills, diaphoresis, hoarse voice or sneezing. Treatments tried: sudafed, ibuprofen. The treatment provided no relief.     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   Other allergic rhinitis 12/18/2021   HTN (hypertension) 12/03/2021   PMDD (premenstrual dysphoric disorder) 07/31/2021   Environmental and seasonal allergies 07/31/2021   Depression with anxiety 08/06/2019   Dysmenorrhea 06/17/2018   ADHD (attention deficit hyperactivity disorder) 10/29/2011    Allergies:  Allergies  Allergen Reactions   Penicillins     REACTION: hives   Medications:  Current Outpatient Medications:    doxycycline  (VIBRA -TABS) 100 MG tablet, Take 1 tablet (  100 mg total) by mouth 2 (two) times daily., Disp: 14 tablet, Rfl: 0   buPROPion (WELLBUTRIN XL) 300 MG 24 hr tablet, , Disp: , Rfl:    drospirenone -ethinyl estradiol  (YAZ) 3-0.02 MG tablet, YAZ, Disp: , Rfl:    fexofenadine (ALLEGRA ODT) 30 MG disintegrating tablet, Take 30 mg by mouth daily.,  Disp: , Rfl:    Minoxidil 5 % FOAM, Apply topically daily at 12 noon., Disp: , Rfl:    propranolol  ER (INDERAL  LA) 60 MG 24 hr capsule, Take 1 capsule (60 mg total) by mouth daily., Disp: 90 capsule, Rfl: 3   spironolactone (ALDACTONE) 100 MG tablet, , Disp: , Rfl:    venlafaxine  (EFFEXOR ) 25 MG tablet, Venlafaxine  HCl, Disp: , Rfl:   Observations/Objective: Patient is well-developed, well-nourished in no acute distress.  Resting comfortably at home.  Head is normocephalic, atraumatic.  No labored breathing.  Speech is clear and coherent with logical content.  Patient is alert and oriented at baseline.    Assessment and Plan: 1. Acute bacterial sinusitis (Primary) - doxycycline  (VIBRA -TABS) 100 MG tablet; Take 1 tablet (100 mg total) by mouth 2 (two) times daily.  Dispense: 14 tablet; Refill: 0  - Worsening symptoms that have not responded to OTC medications.  - Will give Doxycycline  - Continue allergy  medications.  - Steam and humidifier can help - Stay well hydrated and get plenty of rest.  - Seek in person evaluation if no symptom improvement or if symptoms worsen   Follow Up Instructions: I discussed the assessment and treatment plan with the patient. The patient was provided an opportunity to ask questions and all were answered. The patient agreed with the plan and demonstrated an understanding of the instructions.  A copy of instructions were sent to the patient via MyChart unless otherwise noted below.    The patient was advised to call back or seek an in-person evaluation if the symptoms worsen or if the condition fails to improve as anticipated.    Delon CHRISTELLA Dickinson, PA-C

## 2024-04-26 ENCOUNTER — Other Ambulatory Visit: Payer: Self-pay

## 2024-04-26 NOTE — Telephone Encounter (Signed)
 Medication refill request: Yaz Last OV:  02/06/24 Next AEX: not scheduled  Last MMG (if hormonal medication request): n/a Refill authorized: please advise

## 2024-04-27 MED ORDER — DROSPIRENONE-ETHINYL ESTRADIOL 3-0.02 MG PO TABS: 1.0000 | ORAL_TABLET | Freq: Every day | ORAL | 0 refills | Status: DC

## 2024-08-11 MED ORDER — DROSPIRENONE-ETHINYL ESTRADIOL 3-0.02 MG PO TABS: 1.0000 | ORAL_TABLET | Freq: Every day | ORAL | 0 refills | Status: AC

## 2024-08-11 NOTE — Telephone Encounter (Signed)
 Medication refill request: Yaz Last AEX:  none Last OV: 02-06-24 Next AEX: not scheduled Last MMG (if hormonal medication request): none Refill authorized: please approve if appropriate
